# Patient Record
Sex: Male | Born: 1995 | Hispanic: Refuse to answer | Marital: Single
Health system: Midwestern US, Academic
[De-identification: ages and names within clinical notes are randomized; demographics above are authoritative.]

## PROBLEM LIST (undated history)

## (undated) DIAGNOSIS — J45909 Unspecified asthma, uncomplicated: Secondary | ICD-10-CM

## (undated) HISTORY — PX: HERNIA REPAIR: SHX51

---

## 2014-03-08 ENCOUNTER — Emergency Department (HOSPITAL_COMMUNITY): Payer: Managed Care, Other (non HMO)

## 2014-03-08 ENCOUNTER — Emergency Department (HOSPITAL_COMMUNITY)
Admission: EM | Admit: 2014-03-08 | Discharge: 2014-03-08 | Disposition: A | Discharge status: Home / Self Care | Payer: Managed Care, Other (non HMO) | Attending: Emergency Medicine | Admitting: Emergency Medicine

## 2014-03-08 ENCOUNTER — Encounter (HOSPITAL_COMMUNITY): Payer: Self-pay | Admitting: Emergency Medicine

## 2014-03-08 DIAGNOSIS — Y92838 Other recreation area as the place of occurrence of the external cause: Secondary | ICD-10-CM

## 2014-03-08 DIAGNOSIS — Y9366 Activity, soccer: Secondary | ICD-10-CM | POA: Insufficient documentation

## 2014-03-08 DIAGNOSIS — R296 Repeated falls: Secondary | ICD-10-CM | POA: Insufficient documentation

## 2014-03-08 DIAGNOSIS — S83006A Unspecified dislocation of unspecified patella, initial encounter: Secondary | ICD-10-CM

## 2014-03-08 DIAGNOSIS — Z79899 Other long term (current) drug therapy: Secondary | ICD-10-CM | POA: Insufficient documentation

## 2014-03-08 DIAGNOSIS — Z880 Allergy status to penicillin: Secondary | ICD-10-CM | POA: Insufficient documentation

## 2014-03-08 DIAGNOSIS — Y9239 Other specified sports and athletic area as the place of occurrence of the external cause: Secondary | ICD-10-CM | POA: Insufficient documentation

## 2014-03-08 DIAGNOSIS — IMO0002 Reserved for concepts with insufficient information to code with codable children: Secondary | ICD-10-CM | POA: Insufficient documentation

## 2014-03-08 DIAGNOSIS — J45909 Unspecified asthma, uncomplicated: Secondary | ICD-10-CM | POA: Insufficient documentation

## 2014-03-08 HISTORY — DX: Unspecified asthma, uncomplicated: J45.909

## 2014-03-08 MED ORDER — HYDROCODONE-ACETAMINOPHEN 5-325 MG PO TABS: 1.0000 | ORAL_TABLET | Freq: Four times a day (QID) | ORAL | Status: DC | PRN

## 2014-03-08 MED ORDER — IBUPROFEN 600 MG PO TABS: 600.0000 mg | ORAL_TABLET | Freq: Four times a day (QID) | ORAL | Status: DC | PRN

## 2014-03-08 MED ORDER — HYDROMORPHONE HCL PF 1 MG/ML IJ SOLN
2.0000 mg | Freq: Once | INTRAMUSCULAR | Status: AC
  Administered 2014-03-08: 2 mg via INTRAMUSCULAR
  Filled 2014-03-08: qty 2

## 2014-03-08 MED ORDER — DIAZEPAM 5 MG PO TABS
5.0000 mg | ORAL_TABLET | Freq: Once | ORAL | Status: AC
  Administered 2014-03-08: 5 mg via ORAL
  Filled 2014-03-08: qty 1

## 2014-03-08 MED ORDER — IBUPROFEN 400 MG PO TABS
800.0000 mg | ORAL_TABLET | Freq: Once | ORAL | Status: AC
  Administered 2014-03-08: 800 mg via ORAL
  Filled 2014-03-08: qty 2

## 2014-03-08 NOTE — Discharge Instructions (Signed)
Follow up with orthopedics for further evaluation of your patellar dislocation. Wear a knee immobilizer at all times, however, remove your knee immobilizer 4 times a day to promote stretching of your calf muscle and bending of your knee. Use crutches when walking to prevent from bearing weight on your right knee. Take ibuprofen as needed for discomfort. Also recommend ice to the affected area, elevation, and rest. You may take Norco as prescribed for severe pain. Return if symptoms worsen.  Knee Dislocation Knee dislocation is the displacement of the bones that make up the knee. These bones are the thigh bone (femur), the lower leg bones (tibia and fibula), and the kneecap (patella). Strong, fibrous tissues that connect bones to each other (ligaments) support the knee and keep the bones together. Typically, at least 2 of the 4 major ligaments of the knee are torn before a dislocation of the knee can occur.  CAUSES  Knee dislocation is the result of a force that causes an excessive extension of the knee joint (hyperextension) that is greater than the ligaments can withstand. This is often caused by a direct hit (trauma). In rare cases, it is caused by a noncontact injury, such as stepping in a hole in the ground and twisting your knee. Typically, it is associated with vehicular trauma or contact sports. RISK FACTORS Knee dislocations are not common. However, some people are at greater risk of these injuries, including:  People who participate in sports that involve pivoting, jumping, cutting, or changing direction (basketball, gymnastics, soccer, volleyball).  People who participate in contact sports (football, rugby, lacrosse).  People with poor leg strength and flexibility.  People born with greater looseness in their joints. SYMPTOMS  One or more "pops" heard or felt at the time of injury.  Knee swelling within 1 to 2 hours after the injury.  Deformity of your knee.  Loss of motion in your  knee.  A sensation that your knee is "giving way" or "buckling."  Numbness, weakness, discoloration, or coldness of your foot and ankle. This may occur if you also have nerve or blood vessel injury. DIAGNOSIS Knee dislocation is diagnosed using results of a physical exam. Usually, an X-ray exam and an MRI scan (magnetic resonance imaging) are done to see any cartilage or ligament injuries. TREATMENT  Knee dislocations require emergency realignment of the bones (reduction). Once your knee is realigned, it is held in position by a splint or pins drilled into the bones of your upper and lower legs and connected to metal rods outside the leg to hold your knee in position (external fixator). Often, an exam such as an ultrasound exam or angiography will be done to be sure that a major blood vessel has not been damaged. Most often, surgery to repair damaged blood vessels is done when your torn ligaments are repaired. HOME CARE INSTRUCTIONS The following measures can help to reduce pain and hasten the healing process:  Rest your injured joint. Do not move it. Avoid activities similar to the one that caused your injury.  Apply ice to your injured joint for 1 to 2 days after your reduction or as directed by your caregiver. Applying ice helps to reduce inflammation and pain.  Put ice in a plastic bag.  Place a towel between your skin and the bag.  Leave the ice on for 15 to 20 minutes at a time, every couple of hours while you are awake.  Elevate your leg above your heart as instructed by your caregiver.  Move your ankle and toes as instructed by your caregiver.  Take over-the-counter or prescription medicine for pain as directed by your caregiver. SEEK IMMEDIATE MEDICAL CARE IF:  Your splint or external fixator becomes damaged.  Your pain becomes worse rather than better.  You lose feeling in your foot, or you cannot move your ankle and toes. MAKE SURE YOU:  Understand these  instructions.  Will watch your condition.  Will get help right away if you are not doing well or get worse. Document Released: 09/11/2001 Document Revised: 03/10/2012 Document Reviewed: 06/17/2011 [facility] Patient Information 2014 [place], [place]. RICE: Routine Care for Injuries The routine care of many injuries includes Rest, Ice, Compression, and Elevation (RICE). HOME CARE INSTRUCTIONS  Rest is needed to allow your body to heal. Routine activities can usually be resumed when comfortable. Injured tendons and bones can take up to 6 weeks to heal. Tendons are the cord-like structures that attach muscle to bone.  Ice following an injury helps keep the swelling down and reduces pain.  Put ice in a plastic bag.  Place a towel between your skin and the bag.  Leave the ice on for 15-20 minutes, 03-04 times a day. Do this while awake, for the first 24 to 48 hours. After that, continue as directed by your caregiver.  Compression helps keep swelling down. It also gives support and helps with discomfort. If an elastic bandage has been applied, it should be removed and reapplied every 3 to 4 hours. It should not be applied tightly, but firmly enough to keep swelling down. Watch fingers or toes for swelling, bluish discoloration, coldness, numbness, or excessive pain. If any of these problems occur, remove the bandage and reapply loosely. Contact your caregiver if these problems continue.  Elevation helps reduce swelling and decreases pain. With extremities, such as the arms, hands, legs, and feet, the injured area should be placed near or above the level of the heart, if possible. SEEK IMMEDIATE MEDICAL CARE IF:  You have persistent pain and swelling.  You develop redness, numbness, or unexpected weakness.  Your symptoms are getting worse rather than improving after several days. These symptoms may indicate that further evaluation or further X-rays are needed. Sometimes, X-rays may not show a  small broken bone (fracture) until 1 week or 10 days later. Make a follow-up appointment with your caregiver. Ask when your X-ray results will be ready. Make sure you get your X-ray results. Document Released: 03/31/2001 Document Revised: 03/10/2012 Document Reviewed: 05/18/2011 [facility] Patient Information 2014 [place], [place].

## 2014-03-08 NOTE — ED Provider Notes (Signed)
CSN: [id number]     Arrival date & time 03/08/14  1718 History   First MD Initiated Contact with Patient 03/08/14 1726     This chart was scribed for non-physician practitioner, Antony Madura, PA-C working with Gavin Pound. Oletta Lamas, MD by Arlan Organ, ED Scribe. This patient was seen in room TR10C/TR10C and the patient's care was started at 5:43 PM.   Chief Complaint  Patient presents with  . Knee Injury   The history is provided by the patient. No language interpreter was used.    HPI Comments: James Irwin is a 18 y.o. male who presents to the Emergency Department complaining of a right knee injury that occurred just prior to arrival while attempting some soccer drills. Pt states he slide down and landed directly on his knee. He now reports throbbing pain to the injured knee. States bearing weight, bending, and movement exacerbates his pain. Denies any pain at rest. At this time he denies any numbness or loss on sensation to the knee. Denies any prior injury or trauma. Pt has no pertinent medical history, and no other concerns this visit.  Past Medical History  Diagnosis Date  . Asthma    Past Surgical History  Procedure Laterality Date  . Hernia repair     No family history on file. History  Substance Use Topics  . Smoking status: Never Smoker   . Smokeless tobacco: Not on file  . Alcohol Use: No    Review of Systems  Constitutional: Negative for fever and chills.  Musculoskeletal: Positive for arthralgias.  Skin: Negative for rash.  Neurological: Negative for weakness, light-headedness and numbness.      Allergies  Penicillins  Home Medications   Current Outpatient Rx  Name  Route  Sig  Dispense  Refill  . albuterol (PROVENTIL HFA;VENTOLIN HFA) 108 (90 BASE) MCG/ACT inhaler   Inhalation   Inhale 1 puff into the lungs every 6 (six) hours as needed for wheezing or shortness of breath.         . fluticasone (FLOVENT HFA) 110 MCG/ACT inhaler   Inhalation   Inhale 1  puff into the lungs daily.         Marland Kitchen HYDROcodone-acetaminophen (NORCO/VICODIN) 5-325 MG per tablet   Oral   Take 1-2 tablets by mouth every 6 (six) hours as needed.   13 tablet   0   . ibuprofen (ADVIL,MOTRIN) 600 MG tablet   Oral   Take 1 tablet (600 mg total) by mouth every 6 (six) hours as needed.   30 tablet   0   . montelukast (SINGULAIR) 10 MG tablet   Oral   Take 10 mg by mouth at bedtime.           Triage Vitals: BP 139/88  Pulse 95  Temp(Src) 97.7 F (36.5 C) (Oral)  Resp 20  Ht 5\' 11"  (1.803 m)  Wt 155 lb (70.308 kg)  BMI 21.63 kg/m2  SpO2 100%   Physical Exam  Nursing note and vitals reviewed. Constitutional: He is oriented to person, place, and time. He appears well-developed and well-nourished. No distress.  HENT:  Head: Normocephalic and atraumatic.  Eyes: Conjunctivae and EOM are normal. No scleral icterus.  Neck: Normal range of motion.  Cardiovascular: Normal rate, regular rhythm and intact distal pulses.   DP and PT pulses 2+ b/l.  Pulmonary/Chest: Effort normal. No respiratory distress.  Musculoskeletal:       Right knee: He exhibits decreased range of motion (secondary to pain), deformity (  patellar dislocation), abnormal patellar mobility and bony tenderness. He exhibits no swelling, no effusion, no erythema, no LCL laxity and no MCL laxity. Tenderness found. Medial joint line tenderness noted.       Right upper leg: Normal.       Right lower leg: Normal.  Post reduction note: patellar with normal mobility; normal flexion and extension or RLE with mild discomfort with ROM.  Neurological: He is alert and oriented to person, place, and time. He has normal reflexes. He displays normal reflexes. He exhibits normal muscle tone.  Patellar and Achilles reflexes 2+ bilaterally. No gross sensory deficits appreciated.  Skin: Skin is warm and dry. No rash noted. He is not diaphoretic. No erythema. No pallor.  Psychiatric: He has a normal mood and affect.  His behavior is normal.    ED Course  Reduction of dislocation Date/Time: 03/08/2014 8:21 PM Performed by: Antony Madura Authorized by: Antony Madura Consent: Verbal consent obtained. written consent not obtained. The procedure was performed in an emergent situation. Risks and benefits: risks, benefits and alternatives were discussed Consent given by: patient Patient understanding: patient states understanding of the procedure being performed Patient consent: the patient's understanding of the procedure matches consent given Procedure consent: procedure consent matches procedure scheduled Relevant documents: relevant documents present and verified Test results: test results available and properly labeled Site marked: the operative site was marked Imaging studies: imaging studies available Required items: required blood products, implants, devices, and special equipment available Patient identity confirmed: verbally with patient and arm band Time out: Immediately prior to procedure a "time out" was called to verify the correct patient, procedure, equipment, support staff and site/side marked as required. Local anesthesia used: no Patient sedated: no Patient tolerance: Patient tolerated the procedure well with no immediate complications. Comments: Patient given Dilaudid and Valium for pain control prior to reduction. Patella of R knee manually reduced without difficulty. Normal R knee ROM and patellar mobility appreciated post reduction. Patient tolerated well without complications.   (including critical care time)  DIAGNOSTIC STUDIES: Oxygen Saturation is 100% on RA, Normal by my interpretation.    COORDINATION OF CARE: 5:46 PM- Will order X-Ray. Will give Ibuprofen. Discussed treatment plan with pt at bedside and pt agreed to plan.     Labs Review Labs Reviewed - No data to display Imaging Review Dg Knee Complete 4 Views Right  03/08/2014   CLINICAL DATA:  Recent injury with knee  pain  EXAM: RIGHT KNEE - COMPLETE 4+ VIEW  COMPARISON:  None.  FINDINGS: The patella appears to be dislocated laterally. Some irregularity of the superior aspect of the patella may be related to mild avulsion fracture. No other fracture is seen. Mild soft tissue swelling is noted.  IMPRESSION: Mild irregularity in the superior aspect of the patella which may represent a mild avulsion fracture. The patella also appears to be dislocated laterally   Electronically Signed   By: Alcide Clever M.D.   On: 03/08/2014 18:24     EKG Interpretation None      MDM   Final diagnoses:  Patellar dislocation    Uncomplicated dislocation of the right patella secondary to an injury while at soccer practice. Patient neurovascularly intact on physical exam. No gross sensory deficits appreciated in patellar and Achilles reflexes normal bilaterally. X-ray significant for dislocation of the right patella laterally, noted by a deformity on physical exam. There also is a mild irregularity which may reflect a mild avulsion fracture to the patella.  Patella reduced  in ED without difficulty. Patient tolerated procedure well. Post-reduction, patient with normal patellar mobility and ability to flex and extend right knee. Knee immobilizer applied and crutches provided. Patient stable and appropriate for discharge with instruction for orthopedic followup. RICE advised as well as ibuprofen for pain control. Return precautions discussed and patient agreeable to plan with no unaddressed concerns.  I personally performed the services described in this documentation, which was scribed in my presence. The recorded information has been reviewed and is accurate.    Antony MaduraKelly Mattheus Rauls, PA-C 03/08/14 2022

## 2014-03-08 NOTE — ED Provider Notes (Signed)
Medical screening examination/treatment/procedure(s) were conducted as a shared visit with non-physician practitioner(s) and myself.  I personally evaluated the patient during the encounter.    Pt with patellar dislocation of right knee.  Pt couldn't tolerate reduction attempt without medications.  Pt has received 2 mg of dilaudid IM which has helped with initial pain somewhat.  Pt still remains very anxious.  No airway issues, very awake and alert.  Will give oral valium as he is very anxious regarding even attempt at dislocation reduction.      Pt was more relaxed afterwards, see PAC - Hume's note.  Dislocation reduced at bedside by [facility].    Gavin PoundMichael Y. Perrin Eddleman, MD 03/09/14 1348

## 2014-03-08 NOTE — ED Notes (Signed)
Pt reports right knee injury while playing soccer. Pulses intact distal to injury. Obvious injury noted.

## 2015-02-18 ENCOUNTER — Emergency Department (HOSPITAL_COMMUNITY)
Admission: EM | Admit: 2015-02-18 | Discharge: 2015-02-19 | Disposition: A | Discharge status: Home / Self Care | Payer: 59 | Attending: Emergency Medicine | Admitting: Emergency Medicine

## 2015-02-18 DIAGNOSIS — S83004A Unspecified dislocation of right patella, initial encounter: Secondary | ICD-10-CM

## 2015-02-18 DIAGNOSIS — X58XXXA Exposure to other specified factors, initial encounter: Secondary | ICD-10-CM | POA: Insufficient documentation

## 2015-02-18 DIAGNOSIS — Y9289 Other specified places as the place of occurrence of the external cause: Secondary | ICD-10-CM | POA: Insufficient documentation

## 2015-02-18 DIAGNOSIS — Y9389 Activity, other specified: Secondary | ICD-10-CM | POA: Insufficient documentation

## 2015-02-18 DIAGNOSIS — Z79899 Other long term (current) drug therapy: Secondary | ICD-10-CM | POA: Insufficient documentation

## 2015-02-18 DIAGNOSIS — Y998 Other external cause status: Secondary | ICD-10-CM | POA: Diagnosis not present

## 2015-02-18 DIAGNOSIS — Z7951 Long term (current) use of inhaled steroids: Secondary | ICD-10-CM | POA: Diagnosis not present

## 2015-02-18 DIAGNOSIS — J45909 Unspecified asthma, uncomplicated: Secondary | ICD-10-CM | POA: Diagnosis not present

## 2015-02-18 DIAGNOSIS — Z880 Allergy status to penicillin: Secondary | ICD-10-CM | POA: Insufficient documentation

## 2015-02-18 DIAGNOSIS — S8991XA Unspecified injury of right lower leg, initial encounter: Secondary | ICD-10-CM | POA: Diagnosis present

## 2015-02-18 NOTE — ED Notes (Signed)
Pt arrives from home via EMS with c/o R knee dislocation, states he turned with legs and not his hips and knee became dislocated. 250 MCG fentanyl on board. No LOC, pulses good.

## 2015-02-19 ENCOUNTER — Emergency Department (HOSPITAL_COMMUNITY): Payer: 59

## 2015-02-19 ENCOUNTER — Encounter (HOSPITAL_COMMUNITY): Payer: Self-pay | Admitting: Emergency Medicine

## 2015-02-19 MED ORDER — HYDROCODONE-ACETAMINOPHEN 5-325 MG PO TABS: 1.0000 | ORAL_TABLET | Freq: Four times a day (QID) | ORAL | Status: DC | PRN

## 2015-02-19 MED ORDER — PROPOFOL 10 MG/ML IV BOLUS
INTRAVENOUS | Status: AC
  Administered 2015-02-19: 200 mg
  Filled 2015-02-19: qty 20

## 2015-02-19 NOTE — ED Notes (Signed)
Pt placed in a gown and hooked up to the monitor with the 5 lead, BP cuff and pulse ox 

## 2015-02-19 NOTE — ED Provider Notes (Signed)
CSN: [id number]     Arrival date & time 02/18/15  2357 History  This chart was scribed for Loren Raceravid Jailee Jaquez, MD by Roxy Cedarhandni Bhalodia, ED Scribe. This patient was seen in room A03C/A03C and the patient's care was started at 12:06 AM.   Chief Complaint  Patient presents with  . Knee Pain   Patient is a 19 y.o. male presenting with knee pain. The history is provided by the patient. No language interpreter was used.  Knee Pain Location:  Knee Knee location:  R knee Pain details:    Severity:  Moderate Chronicity:  New Dislocation: yes   Foreign body present:  No foreign bodies Prior injury to area:  Yes Relieved by:  Nothing Worsened by:  Nothing tried Ineffective treatments:  None tried  HPI Comments: James Irwin is a 19 y.o. male , brought in by EMS, who presents to the Emergency Department complaining of moderate right knee pain with patellar dislocation which occurred prior to arrival. He states that he was pivoting and "turned with his hips instead of his knees". He states that he has dislocated his right patella before 1 year ago. He denies any other associated pain or injury. He denies LOC. No numbness or weakness.   Past Medical History  Diagnosis Date  . Asthma    Past Surgical History  Procedure Laterality Date  . Hernia repair     No family history on file. History  Substance Use Topics  . Smoking status: Never Smoker   . Smokeless tobacco: Not on file  . Alcohol Use: No   Review of Systems  Musculoskeletal: Positive for arthralgias.  Neurological: Negative for weakness and numbness.  All other systems reviewed and are negative.  Allergies  Penicillins  Home Medications   Prior to Admission medications   Medication Sig Start Date End Date Taking? Authorizing Provider  albuterol (PROVENTIL HFA;VENTOLIN HFA) 108 (90 BASE) MCG/ACT inhaler Inhale 1 puff into the lungs every 6 (six) hours as needed for wheezing or shortness of breath.    Historical Provider, MD   fluticasone (FLOVENT HFA) 110 MCG/ACT inhaler Inhale 1 puff into the lungs daily.    Historical Provider, MD  HYDROcodone-acetaminophen (NORCO/VICODIN) 5-325 MG per tablet Take 1 tablet by mouth every 6 (six) hours as needed for moderate pain or severe pain. 02/19/15   Loren Raceravid Genie Mirabal, MD  ibuprofen (ADVIL,MOTRIN) 600 MG tablet Take 1 tablet (600 mg total) by mouth every 6 (six) hours as needed. 03/08/14   Antony MaduraKelly Humes, PA-C  montelukast (SINGULAIR) 10 MG tablet Take 10 mg by mouth at bedtime.    Historical Provider, MD   Triage Vitals: BP 139/88 mmHg  Pulse 88  Temp(Src) 97.5 F (36.4 C) (Oral)  Resp 17  Ht 5\' 11"  (1.803 m)  Wt 165 lb (74.844 kg)  BMI 23.02 kg/m2  SpO2 100%  Physical Exam  Constitutional: He is oriented to person, place, and time. He appears well-developed and well-nourished. No distress.  HENT:  Head: Normocephalic and atraumatic.  Mouth/Throat: No oropharyngeal exudate.  Eyes: Conjunctivae and EOM are normal. Pupils are equal, round, and reactive to light.  Neck: Normal range of motion. Neck supple. No tracheal deviation present.  Cardiovascular: Normal rate and regular rhythm.   Pulmonary/Chest: Effort normal and breath sounds normal. No respiratory distress. He has no wheezes. He has no rales.  Abdominal: Soft. Bowel sounds are normal. He exhibits no distension and no mass. There is no tenderness. There is no rebound and no guarding.  Musculoskeletal:  He exhibits no edema or tenderness.  Decreased range motion of the right knee due to pain. Right patella is laterally displaced. No obvious injuries. Dorsalis pedis and posterior tibial pulses are intact.  Neurological: He is alert and oriented to person, place, and time.  Flexor hallucis longus and extensor hallucis longus right foot intact. Sensation is fully intact.  Skin: Skin is warm and dry. No rash noted. No erythema.  Psychiatric: He has a normal mood and affect. His behavior is normal.  Nursing note and vitals  reviewed.  ED Course  Reduction of dislocation Date/Time: 02/19/2015 12:31 AM Performed by: Loren Racer Authorized by: Ranae Palms, [place]arah Sitts Consent: Written consent obtained. Imaging studies: imaging studies available Patient identity confirmed: verbally with patient and arm band Time out: Immediately prior to procedure a "time out" was called to verify the correct patient, procedure, equipment, support staff and site/side marked as required. Patient sedated: yes Sedatives: propofol Sedation start date/time: 02/19/2015 12:31 AM Sedation end date/time: 02/19/2015 12:39 AM Vitals: Vital signs were monitored during sedation. Patient tolerance: Patient tolerated the procedure well with no immediate complications Comments: Patella easily reduced with extension of the right knee. Distal PT and DP intact. Immobilizer placed.   (including critical care time)  DIAGNOSTIC STUDIES: Oxygen Saturation is 100% on RA, normal by my interpretation.    COORDINATION OF CARE: 12:09 AM- Discussed plans to give patient pain medication and order diagnostic imaging of right knee. Pt advised of plan for treatment and pt agrees.  Labs Review Labs Reviewed - No data to display  Imaging Review Dg Knee Right Port  02/19/2015   CLINICAL DATA:  Post reduction  EXAM: PORTABLE RIGHT KNEE - 1-2 VIEW  COMPARISON:  None.  FINDINGS: Two views of the right knee confirm successful reduction of the patellar dislocation. No fracture is evident.  IMPRESSION: Successful reduction of the patellar dislocation.   Electronically Signed   By: Ellery Plunk M.D.   On: 02/19/2015 01:02   Dg Knee Right Port  02/19/2015   CLINICAL DATA:  Knee dislocation  EXAM: PORTABLE RIGHT KNEE - 1-2 VIEW  COMPARISON:  None.  FINDINGS: Two views of the right knee obtained portably demonstrate abnormal relationship of the patella with the femur, probably dislocated laterally. No fracture is evident.  IMPRESSION: Patellar dislocation    Electronically Signed   By: Ellery Plunk M.D.   On: 02/19/2015 00:43     EKG Interpretation None     MDM   Final diagnoses:  Patellar dislocation, right, initial encounter    I personally performed the services described in this documentation, which was scribed in my presence. The recorded information has been reviewed and is accurate. Advised follow-up with orthopedics. Return precautions given.   Loren Racer, MD 02/19/15 [phone]

## 2015-02-19 NOTE — Discharge Instructions (Signed)
Patellar Dislocation  A patellar dislocation occurs when your kneecap (patella) slips out of its normal position in a groove in front of the lower end of your thighbone (femur). This groove is called the patellofemoral groove.   CAUSES  The kneecap is normally positioned over the front of the knee joint at the base of the thighbone. A kneecap can be dislocated when:  · The kneecap is out of place (patellar tracking disorder), and force is applied.  · The foot is firmly planted pointing outward, and the knee bends with the thigh turned inward. This kind of injury is common during many sports activities.  · The inner edge of the kneecap is hit, pushing it toward the outer side of the leg.  SIGNS AND SYMPTOMS  · Severe pain.  · A misshapen knee that looks like a bone is out of position.  · A popping sensation, followed by a feeling that something is out of place.  · Inability to bend or straighten the knee.  · Knee swelling.  · Cool, pale skin or numbness and tingling in or below the affected knee.  DIAGNOSIS   Your health care provider will physically examine the injured area. An X-ray exam may be done to make sure a bone fracture has not occurred. In some cases, your health care provider may look inside your knee joint with an instrument much like a pencil-sized telescope (arthroscope). This may be done to make sure you have no loose cartilage in your joint. Loose cartilage is not visible on an X-ray image.  TREATMENT   In many instances, the patella can be guided back into position without much difficulty. It often goes back into position by straightening the leg. Often, nothing more may be needed other than a brief period of immobilization followed by the exercises your health care provider recommends. If patellar dislocation starts to become frequent after the first incident, surgery may be needed to prevent your patella from slipping out of place.  HOME CARE INSTRUCTIONS   · Only take over-the-counter or  prescription medicines for pain, discomfort, or fever as directed by your health care provider.  · Use a knee brace if directed to do so by your health care provider.  · Use crutches as instructed.  · Apply ice to the injured knee:  ¨ Put ice in a plastic bag.  ¨ Place a towel between your skin and the bag.  ¨ Leave the ice on for 20 minutes, 2-3 times a day.  · Follow your health care provider's instructions for doing any recommended range-of-motion exercises or other exercises.  SEEK IMMEDIATE MEDICAL CARE IF:  · You have increased pain or swelling in the knee that is not relieved with medicine.  · You have increasing inflammation in the knee.  · You have locking or catching of your knee.  MAKE SURE YOU:  · Understand these instructions.  · Will watch your condition.  · Will get help right away if you are not doing well or get worse.  Document Released: 09/11/2001 Document Revised: 10/07/2013 Document Reviewed: 07/29/2013  ExitCare® Patient Information ©2015 [employer]. This information is not intended to replace advice given to you by your health care provider. Make sure you discuss any questions you have with your health care provider.

## 2015-04-06 IMAGING — CR DG KNEE 1-2V PORT*R*
2 series · 2 of 2 positions shown · non-contrast
Comparison: None.

CLINICAL DATA: Knee dislocation

EXAM:
PORTABLE RIGHT KNEE - 1-2 VIEW

[AP]
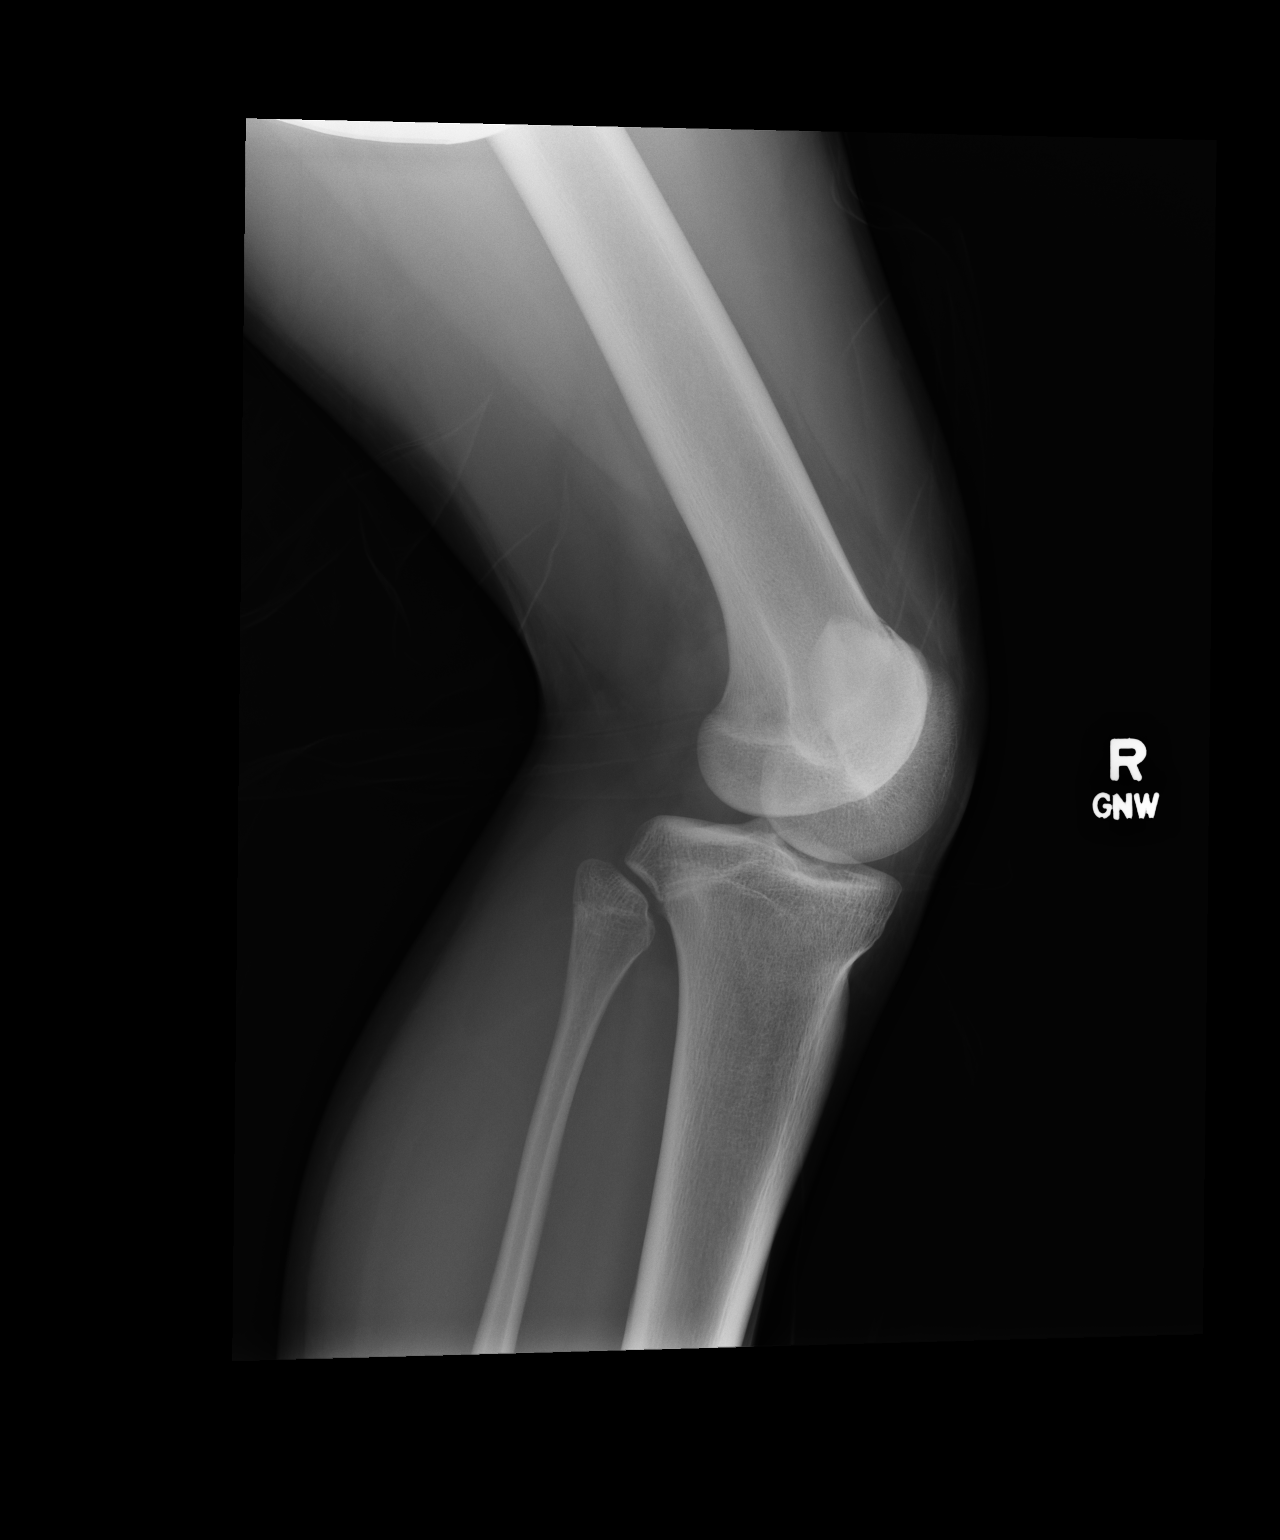

[xtable lateral]
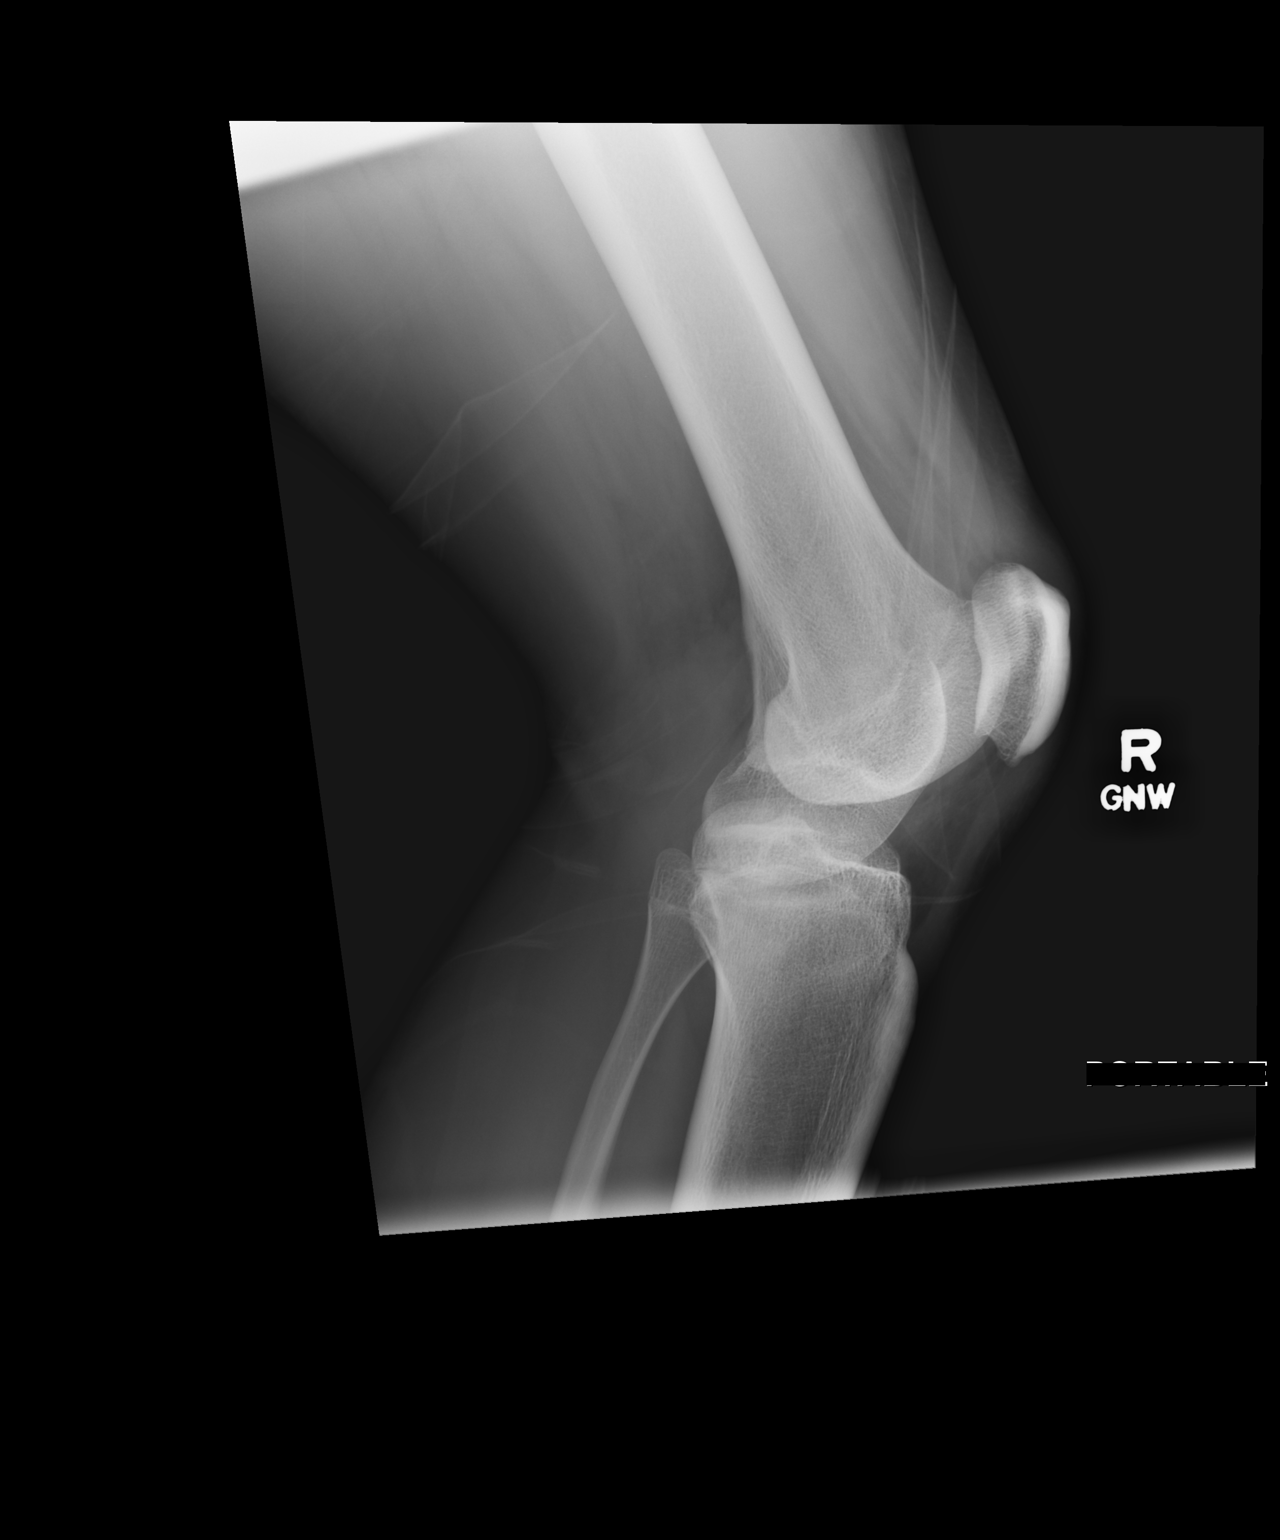

[2 of 2 positions shown; findings below may reference images not displayed]

FINDINGS: Two views of the right knee obtained portably demonstrate abnormal
relationship of the patella with the femur, probably dislocated
laterally. No fracture is evident.
IMPRESSION: Patellar dislocation

## 2016-01-05 ENCOUNTER — Ambulatory Visit: Admit: 2016-01-05 | Discharge: 2016-01-05 | Payer: PRIVATE HEALTH INSURANCE | Attending: Sports Medicine

## 2016-01-05 DIAGNOSIS — M79671 Pain in right foot: Secondary | ICD-10-CM

## 2016-01-05 NOTE — Unmapped (Signed)
Chief Complaint   Patient presents with   ??? New Patient Visit/ Consultation       HPI:      Victor Watkins is a 20 y.o. male who presents for evaluation of complaints of over-pronation of feet bilaterally (R>L).  The patient is seen at the request of Referral, Self.    Pt notes that he began noticing the over-pronation in August when he began playing soccer for his college. He denies any pain or any injuries to his feet b/l. Pt used to play basketball in high school, but never noticed a similar problem before.  His biggest issue is the overpronation and in turning of his right ankle.  Currently play soccer for [employer]. Occasional achy pains with prolonged running and training.  Has never tried anything for his foot or ankle.  Is interested in considering orthotics.      The notes and documentation from the physician extender were reviewed, verified, and edited if necessary.           Past Medical History   Diagnosis Date   ??? Asthma        Past Surgical History   Procedure Laterality Date   ??? Hernia repair          Family History   Problem Relation Age of Onset   ??? Asthma Neg Hx    ??? Hypertension Neg Hx        Medication  Current Outpatient Prescriptions   Medication Sig Dispense Refill   ??? albuterol (PROVENTIL;VENTOLIN;PROAIR) 90 mcg/actuation inhaler 2 puffs.     ??? fluticasone (FLOVENT HFA) 110 mcg/actuation inhaler 2 puffs.     ??? montelukast (SINGULAIR) 10 mg tablet 10 mg.       No current facility-administered medications for this visit.       Allergies  Allergies   Allergen Reactions   ??? Penicillins Anaphylaxis   ??? Sulfasalazine      Other reaction(s): Rash  Bactrim       Review of Systems:  The patient's medical history and review of systems was completed as part of his intake paperwork.  These forms were reviewed with the patient during the visit today, and have been scanned into electronic medical record.  Pertinent items are noted in HPI.    Physical Examination:    This is a pleasant male,  alert, and in no acute distress.    Filed Vitals:    01/05/16 0839   Height: 5' 11 (1.803 m)   Weight: 141 lb (63.957 kg)       Psychiatric:   The patient's mood is normal and appropriate for their visit     Bilateral ankle exam: Notable flat feet R>L. Posterior tib collapse on right. No TTP B/L.  Full range of motion.  Good strength.    Vascular exam:   Extremities warm and well perfused, no significant edema    Respiratory exam: Breathing easy and unlabored    Neuro exam:   No focal neuro deficits    Lymphatic:  No obvious lymphadenopathy    Skin:     Warm, dry    Radiology:     Pt did refuse XR imaging at this time. Will consider in future if necessary.     Assessment:     1. Pes planus of both feet     2. Posterior tibialis tendon insufficiency     3. Bilateral foot pain  Physical Therapy       Plan:  Victor Watkins is a 20 y.o. male with posterior tibilal collapse (R>L) and pes planus bilaterally.  We will get him set up with physical therapy for some custom insoles.  He is going to bring his soccer cleats with him  So that we can ensure that the insoles will fit.  I encouraged him to follow up with his trainer and physical therapist back at school regarding posterior tibialis strengthening exercises.    Discussed the patient's diagnosis, exam, imaging (if applicable), and treatment options:  [x]   Discussed return to activity considerations, including the need to avoid exacerbating activity, high impact, or painful activity  [x]   Discussed the use of pain medication and nonsteroidal anti-inflammatory agents including the risk of side effects which include but not limited to:  the risk of increased blood pressure, bleeding, renal, gastrointestinal, and heart involvement.     [x]  Patient to consider ibuprofen or naprosyn for 1-2 weeks with food.     [x]  The patient can also consider acetaminophen for additional pain if needed.    []  Prescription given for pain medication or anti-inflammatory (see Epic  orders)   [x]  May continue on their previously prescribed pain regimen   [x]   Ice 3-4 x/day for 15-20 min and after activity  []   Consider glucosamine supplementation - 1500 mg/day for 2-3 months    []   Weight management   [x]   Consider in-soles. Referral for semi-custom insoles given today in clinic.   [x]   Reviewed the role of a rehabilitative exercise program:   [x]  Patient elected for a referral to physical therapy (school-based PT in [place])     []  Patient elected for a home based program and instruction was given in the office today  [x]   Reviewed the role of additional diagnostic and treatment options including the role of further imaging, injection therapy, and possible surgical considerations.      Follow-up: as needed.  Sooner with any problems, questions, concerns, or worsening symptoms.      [x]   A copy of my note was sent electronically to the referring provider through our shared medical record or automated fax system.    Lenard Forth, MD  Sports Medicine Fellow    I have seen and evaluated Victor Watkins in the office today with the resident/fellow.  I was physically present during the key or critical portions of the service when performed by the resident/fellow and have verified the above findings and edited the note where appropriate.  I have formulated the working diagnosis and management plans per above.     Casimiro Needle  A. Charlaine Utsey, M.D.  Assistant Professor  [employer] [facility] [employer] [facility]

## 2016-02-24 ENCOUNTER — Emergency Department (HOSPITAL_COMMUNITY): Payer: 59

## 2016-02-24 ENCOUNTER — Encounter (HOSPITAL_COMMUNITY): Payer: Self-pay | Admitting: *Deleted

## 2016-02-24 ENCOUNTER — Emergency Department (HOSPITAL_COMMUNITY)
Admission: EM | Admit: 2016-02-24 | Discharge: 2016-02-24 | Disposition: A | Discharge status: Home / Self Care | Payer: 59 | Attending: Emergency Medicine | Admitting: Emergency Medicine

## 2016-02-24 DIAGNOSIS — Z880 Allergy status to penicillin: Secondary | ICD-10-CM | POA: Diagnosis not present

## 2016-02-24 DIAGNOSIS — M545 Low back pain, unspecified: Secondary | ICD-10-CM

## 2016-02-24 DIAGNOSIS — Z7951 Long term (current) use of inhaled steroids: Secondary | ICD-10-CM | POA: Insufficient documentation

## 2016-02-24 DIAGNOSIS — Z79899 Other long term (current) drug therapy: Secondary | ICD-10-CM | POA: Insufficient documentation

## 2016-02-24 DIAGNOSIS — J45909 Unspecified asthma, uncomplicated: Secondary | ICD-10-CM | POA: Diagnosis not present

## 2016-02-24 LAB — COMPREHENSIVE METABOLIC PANEL
ALT: 27 U/L (ref 17–63)
AST: 48 U/L — ABNORMAL HIGH (ref 15–41)
Albumin: 4.2 g/dL (ref 3.5–5.0)
Alkaline Phosphatase: 71 U/L (ref 38–126)
Anion gap: 10 (ref 5–15)
BUN: 10 mg/dL (ref 6–20)
CO2: 23 mmol/L (ref 22–32)
Calcium: 9.7 mg/dL (ref 8.9–10.3)
Chloride: 102 mmol/L (ref 101–111)
Creatinine, Ser: 1.09 mg/dL (ref 0.61–1.24)
GFR calc Af Amer: >60 mL/min (ref >60)
Glucose, Bld: 121 mg/dL — ABNORMAL HIGH (ref 65–99)
Potassium: 4.2 mmol/L (ref 3.5–5.1)
Sodium: 135 mmol/L (ref 135–145)
Total Bilirubin: 1.4 mg/dL — ABNORMAL HIGH (ref 0.3–1.2)
Total Protein: 7.3 g/dL (ref 6.5–8.1)

## 2016-02-24 LAB — URINALYSIS, ROUTINE W REFLEX MICROSCOPIC
BILIRUBIN URINE: NEGATIVE
GLUCOSE, UA: NEGATIVE mg/dL
HGB URINE DIPSTICK: NEGATIVE
KETONES UR: 40 mg/dL — AB
Leukocytes, UA: NEGATIVE
Nitrite: NEGATIVE
PROTEIN: NEGATIVE mg/dL
Specific Gravity, Urine: 1.011 (ref 1.005–1.030)
pH: 6.5 (ref 5.0–8.0)

## 2016-02-24 LAB — CBC WITH DIFFERENTIAL/PLATELET
BASOS PCT: 0 %
Basophils Absolute: 0 10*3/uL (ref 0.0–0.1)
Eosinophils Absolute: 0 10*3/uL (ref 0.0–0.7)
Eosinophils Relative: 0 %
HCT: 42.3 % (ref 39.0–52.0)
Hemoglobin: 14.4 g/dL (ref 13.0–17.0)
LYMPHS PCT: 14 %
Lymphs Abs: 0.8 10*3/uL (ref 0.7–4.0)
MCH: 26.6 pg (ref 26.0–34.0)
MCHC: 34 g/dL (ref 30.0–36.0)
MCV: 78 fL (ref 78.0–100.0)
MONO ABS: 0.5 10*3/uL (ref 0.1–1.0)
Monocytes Relative: 9 %
Neutro Abs: 4.1 10*3/uL (ref 1.7–7.7)
Neutrophils Relative %: 77 %
PLATELETS: 174 10*3/uL (ref 150–400)
RBC: 5.42 MIL/uL (ref 4.22–5.81)
RDW: 14.1 % (ref 11.5–15.5)
WBC: 5.3 10*3/uL (ref 4.0–10.5)

## 2016-02-24 MED ORDER — HYDROCODONE-ACETAMINOPHEN 5-325 MG PO TABS: 1.0000 | ORAL_TABLET | Freq: Four times a day (QID) | ORAL | Status: AC | PRN

## 2016-02-24 MED ORDER — ONDANSETRON HCL 4 MG/2ML IJ SOLN
4.0000 mg | Freq: Once | INTRAMUSCULAR | Status: AC
  Administered 2016-02-24: 4 mg via INTRAVENOUS
  Filled 2016-02-24: qty 2

## 2016-02-24 MED ORDER — MORPHINE SULFATE (PF) 4 MG/ML IV SOLN
4.0000 mg | Freq: Once | INTRAVENOUS | Status: AC
  Administered 2016-02-24: 4 mg via INTRAVENOUS
  Filled 2016-02-24: qty 1

## 2016-02-24 MED ORDER — IBUPROFEN 600 MG PO TABS: 600.0000 mg | ORAL_TABLET | Freq: Three times a day (TID) | ORAL | Status: AC | PRN

## 2016-02-24 NOTE — Discharge Instructions (Signed)
If you develop worse back pain, fevers, pain moving down your legs or weakness or numbness or any other concerning symptoms then returned to the ER immediately.

## 2016-02-24 NOTE — ED Notes (Signed)
The pt has had back lower since yesterday with a headache legs hurt  With some n v.  He had ibu 4 hours ago  But that has not helped

## 2016-02-24 NOTE — ED Provider Notes (Addendum)
Patient CT scan shows no acute pathology. Patient is feeling well currently. Has been afebrile, normal WBC. No reproducible back tenderness on my exam. No neuro signs or symptoms. He feels well, wants to be discharged. Given no acute pathology, will treat as a possible MSK etiology and discussed strict return precautions. Also discussed with his mom. She is questioning a strep test given that he was also complaining of a sore throat over the last couple days. He is currently afebrile, no exudate or swollen tonsils, and no lymphadenopathy. His CENTOR score is 1, no further testing or antibiotics.   Pricilla Loveless, MD 02/24/16 [phone]  Results for orders placed or performed during the hospital encounter of 02/24/16  Urinalysis, Routine w reflex microscopic (not at [facility])  Result Value Ref Range   Color, Urine YELLOW YELLOW   APPearance CLEAR CLEAR   Specific Gravity, Urine 1.011 1.005 - 1.030   pH 6.5 5.0 - 8.0   Glucose, UA NEGATIVE NEGATIVE mg/dL   Hgb urine dipstick NEGATIVE NEGATIVE   Bilirubin Urine NEGATIVE NEGATIVE   Ketones, ur 40 (A) NEGATIVE mg/dL   Protein, ur NEGATIVE NEGATIVE mg/dL   Nitrite NEGATIVE NEGATIVE   Leukocytes, UA NEGATIVE NEGATIVE  CBC with Differential/Platelet  Result Value Ref Range   WBC 5.3 4.0 - 10.5 K/uL   RBC 5.42 4.22 - 5.81 MIL/uL   Hemoglobin 14.4 13.0 - 17.0 g/dL   HCT 47.8 29.5 - 62.1 %   MCV 78.0 78.0 - 100.0 fL   MCH 26.6 26.0 - 34.0 pg   MCHC 34.0 30.0 - 36.0 g/dL   RDW 30.8 65.7 - 84.6 %   Platelets 174 150 - 400 K/uL   Neutrophils Relative % 77 %   Neutro Abs 4.1 1.7 - 7.7 K/uL   Lymphocytes Relative 14 %   Lymphs Abs 0.8 0.7 - 4.0 K/uL   Monocytes Relative 9 %   Monocytes Absolute 0.5 0.1 - 1.0 K/uL   Eosinophils Relative 0 %   Eosinophils Absolute 0.0 0.0 - 0.7 K/uL   Basophils Relative 0 %   Basophils Absolute 0.0 0.0 - 0.1 K/uL  Comprehensive metabolic panel  Result Value Ref Range   Sodium 135 135 - 145 mmol/L   Potassium 4.2 3.5 -  5.1 mmol/L   Chloride 102 101 - 111 mmol/L   CO2 23 22 - 32 mmol/L   Glucose, Bld 121 (H) 65 - 99 mg/dL   BUN 10 6 - 20 mg/dL   Creatinine, Ser 9.62 0.61 - 1.24 mg/dL   Calcium 9.7 8.9 - 95.2 mg/dL   Total Protein 7.3 6.5 - 8.1 g/dL   Albumin 4.2 3.5 - 5.0 g/dL   AST 48 (H) 15 - 41 U/L   ALT 27 17 - 63 U/L   Alkaline Phosphatase 71 38 - 126 U/L   Total Bilirubin 1.4 (H) 0.3 - 1.2 mg/dL   GFR calc non Af Amer >60 >60 mL/min   GFR calc Af Amer >60 >60 mL/min   Anion gap 10 5 - 15   Ct Renal Stone Study  02/24/2016  CLINICAL DATA:  Lower abdominal pain radiating to back EXAM: CT ABDOMEN AND PELVIS WITHOUT CONTRAST TECHNIQUE: Multidetector CT imaging of the abdomen and pelvis was performed following the standard protocol without oral or intravenous contrast material administration. COMPARISON:  None. FINDINGS: Lower chest:  Lung bases are clear. Hepatobiliary: No focal liver lesions are identified on this noncontrast enhanced study. Gallbladder wall does not appear appreciably thickened. There is  no biliary duct dilatation. Pancreas: No pancreatic mass or inflammatory focus. Spleen: No splenic lesions are identified. Adrenals/Urinary Tract: Adrenals appear normal bilaterally. Kidneys bilaterally show no mass or hydronephrosis on either side. There is no renal or ureteral calculus on either side. The urinary bladder is midline with wall thickness within normal limits. Stomach/Bowel: There is no appreciable bowel wall or mesenteric thickening. No bowel obstruction. No free air or portal venous air. Vascular/Lymphatic: There is no demonstrable abdominal aortic aneurysm. No vascular lesions are identified on this noncontrast enhanced study. No adenopathy is appreciable in the abdomen or pelvis. Reproductive: Prostate and seminal vesicles appear normal. No pelvic mass or pelvic fluid collection. Other: Appendix appears normal. No abscess or ascites in the abdomen or pelvis. Musculoskeletal: There are no  blastic or lytic bone lesions. No intramuscular or abdominal wall lesion. IMPRESSION: A cause for patient's symptoms has not been established with this study. No renal or ureteral calculus.  No hydronephrosis on either side. No bowel wall thickening or bowel obstruction. No abscess. Appendix appears normal. No bone lesions apparent. Electronically Signed   By: Bretta Bang III M.D.   On: 02/24/2016 08:30      Pricilla Loveless, MD 02/24/16 [phone]

## 2016-02-24 NOTE — ED Notes (Signed)
No known injury

## 2016-02-24 NOTE — ED Notes (Signed)
Pt returned to room from imaging. 

## 2016-02-24 NOTE — ED Notes (Signed)
Patient transported to CT 

## 2016-02-24 NOTE — ED Provider Notes (Signed)
CSN: [id number]     Arrival date & time 02/24/16  0218 History   First MD Initiated Contact with Patient 02/24/16 0459     Chief Complaint  Patient presents with  . Back Pain     (Consider location/radiation/quality/duration/timing/severity/associated sxs/prior Treatment) Patient is a 20 y.o. male presenting with back pain. The history is provided by the patient.  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 Asthma    Past Surgical History  Procedure Laterality Date  . Hernia repair     No family history on file. Social History  Substance Use Topics  . Smoking status: Never Smoker   . Smokeless tobacco: None  . Alcohol Use: No    Review of Systems  Constitutional: Negative for fever.  Cardiovascular: Negative for chest pain.  Gastrointestinal: Negative for abdominal pain.  Genitourinary: Negative for dysuria.  Musculoskeletal: Positive for back pain.  Neurological: Negative for weakness.  All other systems reviewed and are negative.     Allergies  Penicillins  Home Medications   Prior to Admission medications   Medication Sig Start Date End Date Taking? Authorizing Provider  albuterol (PROVENTIL HFA;VENTOLIN HFA) 108 (90 BASE) MCG/ACT inhaler Inhale 1 puff into the lungs every 6 (six) hours as needed for wheezing or shortness of breath.    Historical Provider, MD  fluticasone (FLOVENT HFA)  110 MCG/ACT inhaler Inhale 1 puff into the lungs daily.    Historical Provider, MD  HYDROcodone-acetaminophen (NORCO/VICODIN) 5-325 MG per tablet Take 1 tablet by mouth every 6 (six) hours as needed for moderate pain or severe pain. 02/19/15   Loren Racer, MD  ibuprofen (ADVIL,MOTRIN) 600 MG tablet Take 1 tablet (600 mg total) by mouth every 6 (six) hours as needed. 03/08/14   Antony Madura, PA-C  montelukast (SINGULAIR) 10 MG tablet Take 10 mg by mouth at bedtime.    Historical Provider, MD   BP 118/74 mmHg  Pulse 75  Temp(Src) 98 F (36.7 C) (Oral)  Resp 16  Ht  (1.803 m)  Wt 153 lb 8 oz (69.627 kg)  BMI 21.42 kg/m2  SpO2 100% Physical Exam  Constitutional: He is oriented to person, place, and time. He appears well-developed and well-nourished. No distress.  HENT:  Head: Normocephalic and atraumatic.  Mouth/Throat: Oropharynx is clear and moist.  Eyes: Conjunctivae and EOM are normal. Pupils are equal, round, and reactive to light.  Neck: Normal range of motion. Neck supple.  Cardiovascular: Normal rate, regular rhythm and intact distal pulses.   No murmur heard. Pulmonary/Chest: Effort normal and breath sounds normal. No respiratory distress. He has no wheezes. He has no rales.  Abdominal: Soft. He exhibits no distension. There is no tenderness. There is no rebound and no guarding.  No CVA tenderness  Musculoskeletal: Normal range of motion. He exhibits no edema or tenderness.  Neurological: He is alert and oriented to person, place, and time.  Skin:  Skin is warm and dry. No rash noted. No erythema.  Psychiatric: He has a normal mood and affect. His behavior is normal.  Nursing note and vitals reviewed.   ED Course  Procedures (including critical care time) Labs Review Labs Reviewed  URINALYSIS, ROUTINE W REFLEX MICROSCOPIC (NOT AT [facility]) - Abnormal; Notable for the following:    Ketones, ur 40 (*)    All other components within normal limits  COMPREHENSIVE METABOLIC  PANEL - Abnormal; Notable for the following:    Glucose, Bld 121 (*)    AST 48 (*)    Total Bilirubin 1.4 (*)    All other components within normal limits  CBC WITH DIFFERENTIAL/PLATELET    Imaging Review No results found. I have personally reviewed and evaluated these images and lab results as part of my medical decision-making.   EKG Interpretation None      MDM   Final diagnoses:  None    Patient is a healthy 20 year old male presenting with sudden onset of back pain at 4 PM that has caused nausea and vomiting without improvement in symptoms. He denies any urinary symptoms, fever, diarrhea. He is never had symptoms like this before. Patient has no reproducible pain on exam and the pain does not radiate into his abdomen or testicles. Vital signs are within normal limits.  Low suspicion for a AAA or aortic dissection however concern for possible kidney stone or renal pathology. Patient was given IV pain control, UA, CBC, CMP pending  6:41 AM UA with 40 ketones but otherwise normal. CMP with mild elevation of AST only and patient does not drink or use drugs. Patient's pain is improved with pain medication but still has some back pain. Will do a CT stone study to ensure it's not a renal stone. He has no pain however that radiate into the legs or have any sciatic or nerve type sensations. He is not a drug user so no concern for discitis or osteomyelitis.  Pt checked out to Dr. Criss Alvine at [address] MD 02/25/16 2102

## 2016-04-10 IMAGING — CT CT RENAL STONE PROTOCOL
2 of 4 series · 16 of 46 positions shown, 18 images · non-contrast
Comparison: None.

CLINICAL DATA: Lower abdominal pain radiating to back

EXAM:
CT ABDOMEN AND PELVIS WITHOUT CONTRAST
TECHNIQUE: Multidetector CT imaging of the abdomen and pelvis was performed
following the standard protocol without oral or intravenous contrast
material administration.

[Series 2: stone study 5.0 i30f 1 · axial · 0.61mm/px · z∈[+437,+822]mm · 13 of 85 slices shown, 15 images]
[im 4/85  soft-tissue]
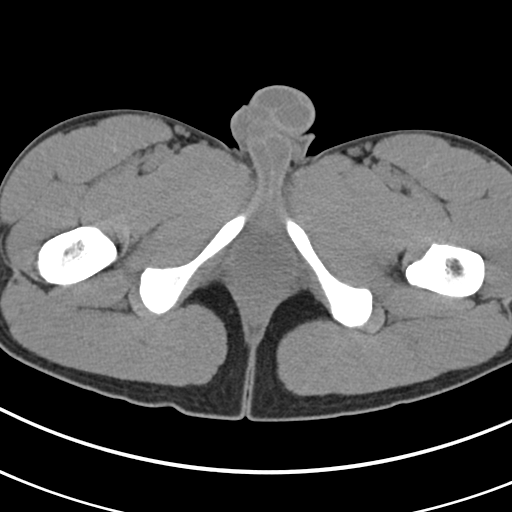
[im 4/85  bone]
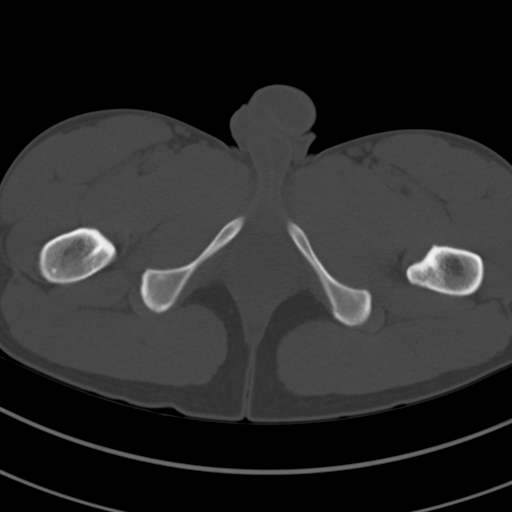
[im 11/85  soft-tissue]
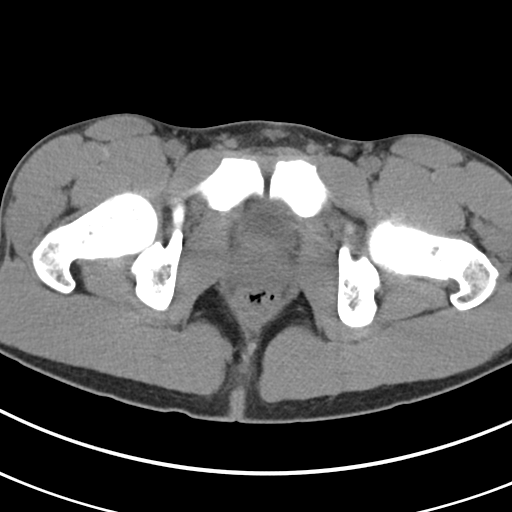
[im 17/85  soft-tissue]
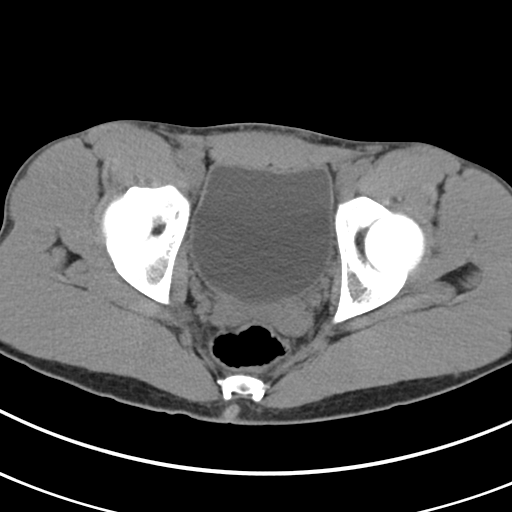
[im 24/85  soft-tissue]
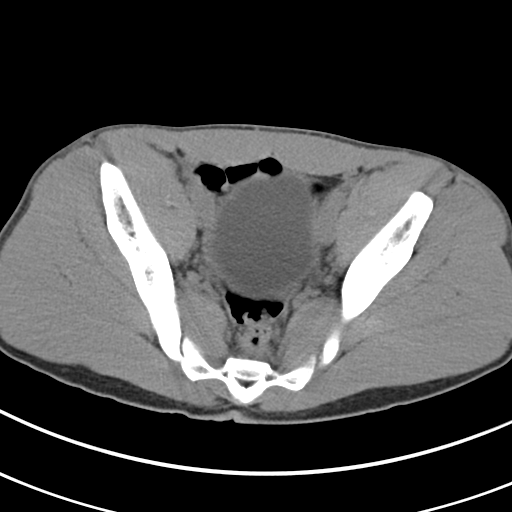
[im 31/85  soft-tissue]
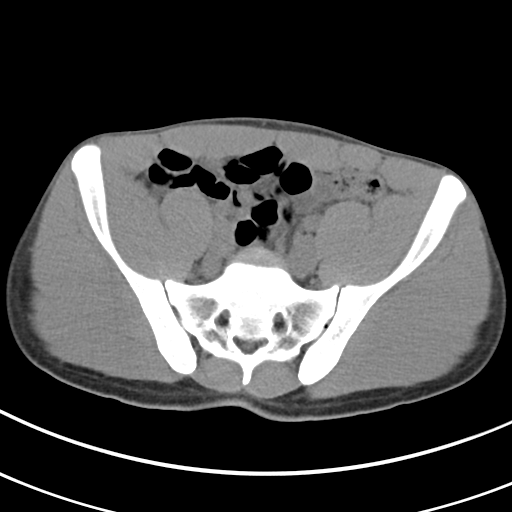
[im 37/85  soft-tissue]
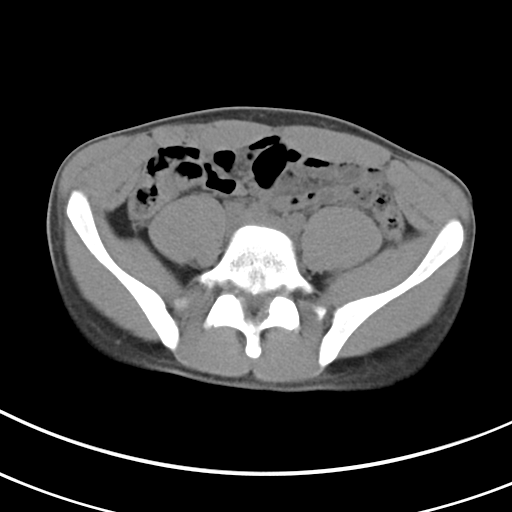
[im 44/85  soft-tissue]
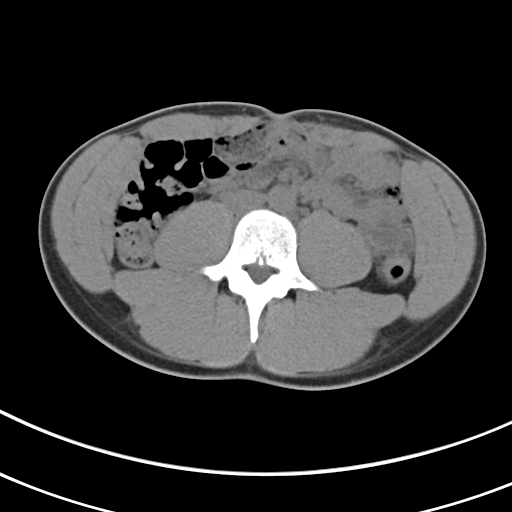
[im 48/85  soft-tissue]
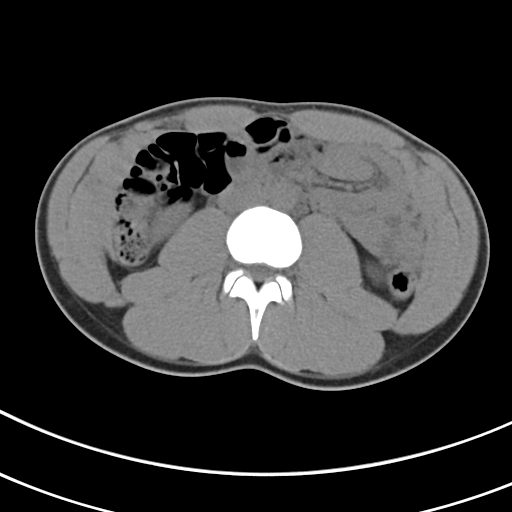
[im 54/85  soft-tissue]
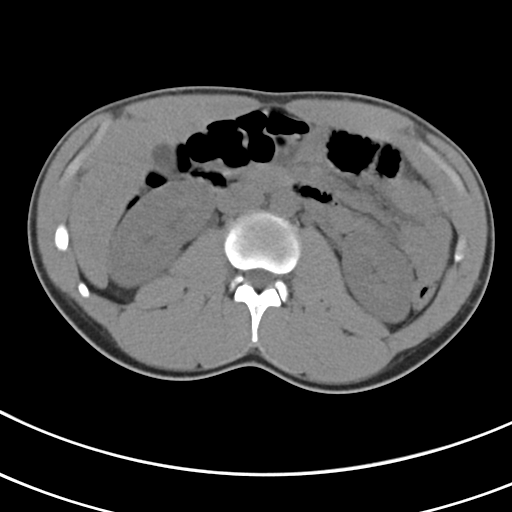
[im 54/85  bone]
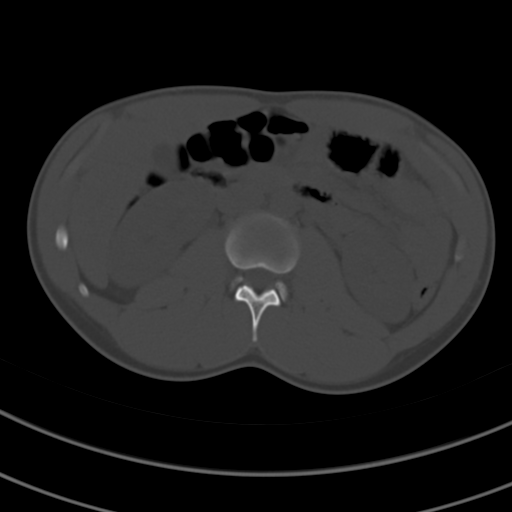
[im 61/85  soft-tissue]
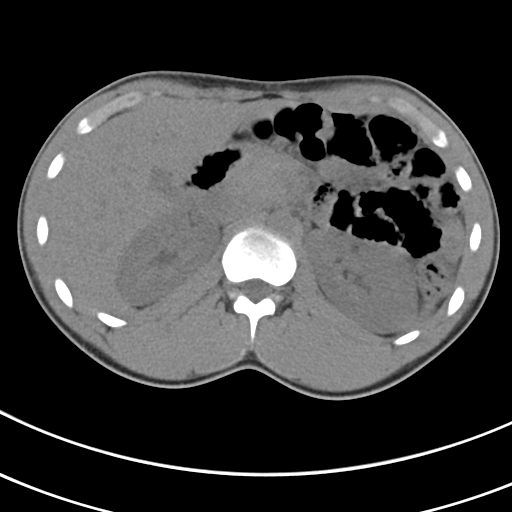
[im 68/85  soft-tissue]
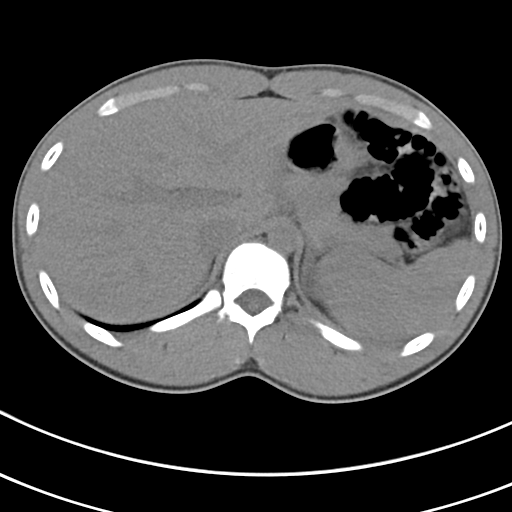
[im 74/85  soft-tissue]
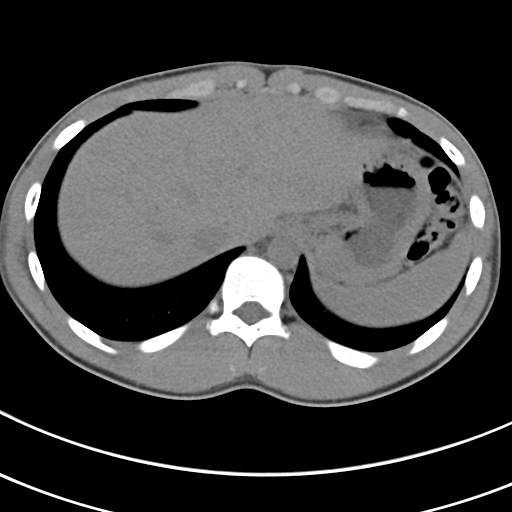
[im 81/85  soft-tissue]
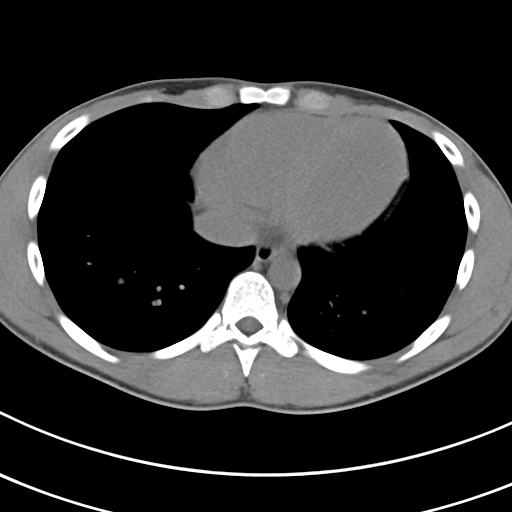

[Series 5: coronal soft tissue · coronal · 0.83mm/px · 3 of 101 slices shown]
[im 34/101  soft-tissue]
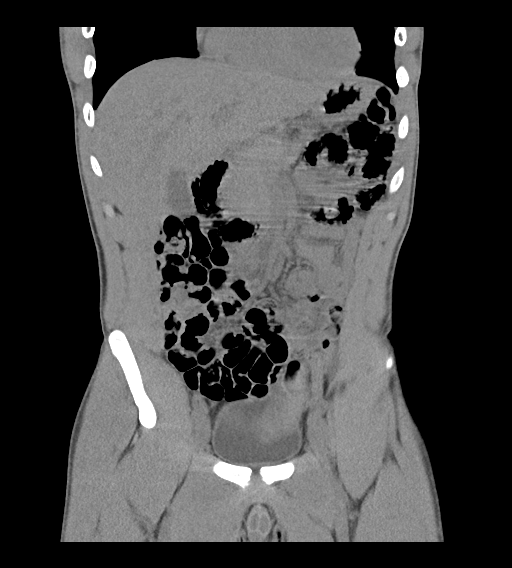
[im 45/101  soft-tissue]
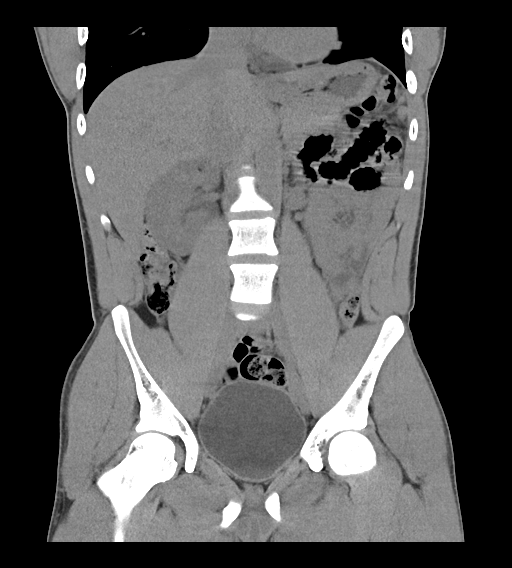
[im 56/101  soft-tissue]
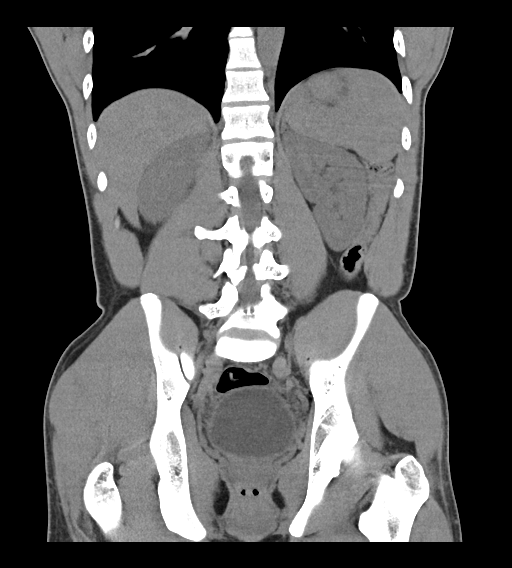

[16 of 46 positions shown; findings below may reference images not displayed]

FINDINGS: Lower chest:  Lung bases are clear.

Hepatobiliary: No focal liver lesions are identified on this
noncontrast enhanced study. Gallbladder wall does not appear
appreciably thickened. There is no biliary duct dilatation.

Pancreas: No pancreatic mass or inflammatory focus.

Spleen: No splenic lesions are identified.

Adrenals/Urinary Tract: Adrenals appear normal bilaterally. Kidneys
bilaterally show no mass or hydronephrosis on either side. There is
no renal or ureteral calculus on either side. The urinary bladder is
midline with wall thickness within normal limits.

Stomach/Bowel: There is no appreciable bowel wall or mesenteric
thickening. No bowel obstruction. No free air or portal venous air.

Vascular/Lymphatic: There is no demonstrable abdominal aortic
aneurysm. No vascular lesions are identified on this noncontrast
enhanced study. No adenopathy is appreciable in the abdomen or
pelvis.

Reproductive: Prostate and seminal vesicles appear normal. No pelvic
mass or pelvic fluid collection.

Other: Appendix appears normal. No abscess or ascites in the abdomen
or pelvis.

Musculoskeletal: There are no blastic or lytic bone lesions. No
intramuscular or abdominal wall lesion.
IMPRESSION: A cause for patient's symptoms has not been established with this
study.

No renal or ureteral calculus.  No hydronephrosis on either side.

No bowel wall thickening or bowel obstruction. No abscess. Appendix
appears normal. No bone lesions apparent.
